# Patient Record
Sex: Female | Born: 2000 | Race: Black or African American | Hispanic: No | Marital: Single | Smoking: Current every day smoker
Health system: Southern US, Community
[De-identification: ages and names within clinical notes are randomized; demographics above are authoritative.]

## PROBLEM LIST (undated history)

## (undated) ENCOUNTER — Inpatient Hospital Stay (HOSPITAL_COMMUNITY): Payer: Self-pay

## (undated) ENCOUNTER — Ambulatory Visit (HOSPITAL_COMMUNITY): Payer: MEDICAID

## (undated) DIAGNOSIS — G40309 Generalized idiopathic epilepsy and epileptic syndromes, not intractable, without status epilepticus: Secondary | ICD-10-CM

## (undated) DIAGNOSIS — F32A Depression, unspecified: Secondary | ICD-10-CM

## (undated) HISTORY — DX: Depression, unspecified: F32.A

## (undated) HISTORY — DX: Generalized idiopathic epilepsy and epileptic syndromes, not intractable, without status epilepticus: G40.309

## (undated) HISTORY — PX: NO PAST SURGERIES: SHX2092

---
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
## 2022-01-04 ENCOUNTER — Encounter: Payer: Medicaid Other | Admitting: Family Medicine

## 2022-03-14 ENCOUNTER — Ambulatory Visit (HOSPITAL_COMMUNITY)
Admission: EM | Admit: 2022-03-14 | Discharge: 2022-03-14 | Disposition: A | Discharge status: Home / Self Care | Payer: Medicaid Other | Attending: Psychiatry | Admitting: Psychiatry

## 2022-03-14 DIAGNOSIS — F4323 Adjustment disorder with mixed anxiety and depressed mood: Secondary | ICD-10-CM | POA: Insufficient documentation

## 2022-03-14 DIAGNOSIS — B009 Herpesviral infection, unspecified: Secondary | ICD-10-CM | POA: Insufficient documentation

## 2022-03-14 NOTE — ED Provider Notes (Signed)
Behavioral Health Urgent Care Medical Screening Exam ? ?Patient Name: Samantha Rodriguez ?MRN: [id number] ?Date of Evaluation: 03/14/22 ?Chief Complaint:   ?Diagnosis:  ?Final diagnoses:  ?Adjustment disorder with mixed anxiety and depressed mood  ? ? ?History of Present illness: Samantha Rodriguez is a 21 y.o. female patient presented to [facility] as a walk in alone requesting outpatient psychiatric resources.  ? ?Samantha Rodriguez, 21 y.o., female patient seen face to face by this provider, consulted with Dr. Bronwen Betters; and chart reviewed on 03/14/22.  Patient reports no past psychiatric history. She does not take any medications at this time. Reports she did see a counselor at the [facility] after was diagnosed with the STD. She denies any IP admissions. She denies using any substances.  ? ?During evaluation Samantha Rodriguez is alert/oriented x 4 and cooperative. She is speaking in a clear tone at moderate volume, and normal pace; with good eye contact. She has normal speech and behavior. Her    thought process is coherent and relevant; There is no indication that she is currently responding to internal/external stimuli or experiencing  paranoid or delusional thought content. She denies AVH/SI/HI and self harm. She endorses increased anxiety and depression triggered by her birthday which is today. Reports one year ago on this day she obtained a sexually transmitted disease (herpes). States this has been traumatic for her and she is having trouble moving past it. She endorses feelings of low self esteem, feeling worthless, low motivation and feeling helpless. She is rejected by men and this also increases her depression.  She has had an increase in appetite and has gained 30lbs. She denies any concerns with sleep. Patient is logical and answered questions appropriately.   ? ?Discussed PHP program with patient and she is willing to participate, referral was made.  ? ?At this time Samantha Rodriguez is educated and verbalizes  understanding of mental health resources and other crisis services in the community. She is instructed to call 911 and present to the nearest emergency room should she experience any suicidal/homicidal ideation, auditory/visual/hallucinations, or detrimental worsening of her mental health condition. She was a also advised by Clinical research associate that she could call the toll-free phone on insurance card to assist with identifying in network counselors and agencies or number on back of Medicaid card to speak with care coordinator ?  ? ?Psychiatric Specialty Exam ? ?Presentation  ?General Appearance:Appropriate for Environment ? ?Eye Contact:Good ? ?Speech:Clear and Coherent; Normal Rate ? ?Speech Volume:No data recorded ?Handedness:Right ? ? ?Mood and Affect  ?Mood:Anxious; Depressed ? ?Affect:Appropriate ? ? ?Thought Process  ?Thought Processes:Coherent ? ?Descriptions of Associations:Intact ? ?Orientation:Full (Time, Place and Person) ? ?Thought Content:Logical ?   Hallucinations:None ? ?Ideas of Reference:None ? ?Suicidal Thoughts:No ? ?Homicidal Thoughts:No ? ? ?Sensorium  ?Memory:Immediate Good; Recent Good; Remote Good ? ?Judgment:Good ? ?Insight:Good ? ? ?Executive Functions  ?Concentration:Good ? ?Attention Span:Good ? ?Recall:Good ? ?Fund of Knowledge:Good ? ?Language:Good ? ? ?Psychomotor Activity  ?Psychomotor Activity:Normal ? ? ?Assets  ?Assets:Communication Skills; Desire for Improvement; Financial Resources/Insurance; Housing; Leisure Time; Physical Health; Resilience ? ? ?Sleep  ?Sleep:Good ? ?Number of hours: No data recorded ? ?No data recorded ? ?Physical Exam: ?Physical Exam ?Vitals and nursing note reviewed.  ?Constitutional:   ?   General: She is not in acute distress. ?   Appearance: Normal appearance. She is not ill-appearing.  ?HENT:  ?   Head: Normocephalic.  ?Eyes:  ?   General:     ?  Right eye: No discharge.     ?   Left eye: No discharge.  ?   Conjunctiva/sclera: Conjunctivae normal.  ?Cardiovascular:   ?   Rate and Rhythm: Normal rate.  ?Pulmonary:  ?   Effort: Pulmonary effort is normal. No respiratory distress.  ?Musculoskeletal:     ?   General: Normal range of motion.  ?   Cervical back: Normal range of motion.  ?Skin: ?   Coloration: Skin is not jaundiced or pale.  ?Neurological:  ?   Mental Status: She is alert and oriented to person, place, and time.  ?Psychiatric:     ?   Attention and Perception: Attention and perception normal.     ?   Mood and Affect: Mood is anxious and depressed.     ?   Speech: Speech normal.     ?   Behavior: Behavior normal. Behavior is cooperative.     ?   Thought Content: Thought content normal.     ?   Cognition and Memory: Cognition normal.     ?   Judgment: Judgment normal.  ? ?Review of Systems  ?Constitutional: Negative.   ?HENT: Negative.    ?Eyes: Negative.   ?Respiratory: Negative.    ?Cardiovascular: Negative.   ?Musculoskeletal: Negative.   ?Skin: Negative.   ?Neurological: Negative.   ?Psychiatric/Behavioral:  Positive for depression. The patient is nervous/anxious.   ?There were no vitals taken for this visit. There is no height or weight on file to calculate BMI. ? ?Musculoskeletal: ?Strength & Muscle Tone: within normal limits ?Gait & Station: normal ?Patient leans: N/A ? ? ?[facility] MSE Discharge Disposition for Follow up and Recommendations: ?Based on my evaluation the patient does not appear to have an emergency medical condition and can be discharged with resources and follow up care in outpatient services for Medication Management, Partial Hospitalization Program, and Individual Therapy ? ?Discharge patient.  ? ?Referral made to [facility] PHP program.  ? ?Provided out patient psychiatric resources.  ? ?No evidence of imminent risk to self or others at present.    ?Patient does not meet criteria for psychiatric inpatient admission. ?Discussed crisis plan, support from social network, calling 911, coming to the Emergency Department, and  calling Suicide Hotline.  ? ? ?Ardis Hughs, NP ?03/14/2022, 3:45 PM ? ?

## 2022-03-14 NOTE — Progress Notes (Signed)
?   03/14/22 1352  ?[facility] Triage Screening (Walk-ins at [facility] only)  ?How Did You Hear About [place]? Self  ?What Is the Reason for Your Visit/Call Today? 21 year old Samantha Rodriguez present to the [facility] with depressive symptoms triggered by a sexual disease she got last year on her birthday (herpes).  Today being the anniversary of the traumatic experience patient report being depressed. Report since learning her status she's been unable sleep, self-conscious, low self-esteem, weight gain-30 pounds, and being rejected by men. Pt denied suicidal/homicidal thoughts and auditory/visual hallucinations.  ?How Long Has This Been Causing You Problems? > than 6 months  ?Have You Recently Had Any Thoughts About Hurting Yourself? No  ?Are You Planning to Commit Suicide/Harm Yourself At This time? No  ?Have you Recently Had Thoughts About Hurting Someone Karolee Ohs? No  ?Are You Planning To Harm Someone At This Time? No  ?Are you currently experiencing any auditory, visual or other hallucinations? No  ?Have You Used Any Alcohol or Drugs in the Past 24 Hours? No  ?Do you have any current medical co-morbidities that require immediate attention? No  ?Clinician description of patient physical appearance/behavior: Depressed appropriate for the weather, cooperative and pleasant.  ?What Do You Feel Would Help You the Most Today? Stress Management  ?If access to [facility] was not available, would you have sought care in the Emergency Department? No  ?Determination of Need Routine (7 days)  ?Options For Referral Outpatient Therapy;Medication Management  ? ? ?

## 2022-03-14 NOTE — Discharge Instructions (Addendum)
The suicide prevention education provided includes the following: Suicide risk factors Suicide prevention and interventions National Suicide Hotline telephone number [facility] assessment telephone number [place] Emergency Assistance 911 County and/or Residential Mobile Crisis Unit telephone number   Request made of family/significant other to: Remove weapons (e.g., guns, rifles, knives), all items previously/currently identified as safety concern.   Remove drugs/medications (over the counter, prescriptions, illicit drugs), all items previously/currently identified as a safety concern.    Please contact one of the following facilities to start medication management and therapy services:   [facility] [address] (SECOND FLOOR)  [place], [place]  [address] Phone: [phone]  [facility]  [address] , [place] [address] Phone: [phone]  [facility] [place]  [address]  [place], [place] [address]  [facility] [place]  [address]  [place], [place] [address] Phone: [phone]   

## 2022-03-15 ENCOUNTER — Telehealth (HOSPITAL_COMMUNITY): Payer: Self-pay | Admitting: Professional

## 2022-03-17 ENCOUNTER — Telehealth (HOSPITAL_COMMUNITY): Payer: Self-pay | Admitting: Professional

## 2022-03-17 ENCOUNTER — Ambulatory Visit (HOSPITAL_COMMUNITY): Payer: Medicaid Other | Admitting: Professional

## 2022-03-17 DIAGNOSIS — F332 Major depressive disorder, recurrent severe without psychotic features: Secondary | ICD-10-CM | POA: Insufficient documentation
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
## 2022-03-21 ENCOUNTER — Ambulatory Visit (HOSPITAL_COMMUNITY): Payer: Medicaid Other

## 2022-03-22 ENCOUNTER — Encounter (HOSPITAL_COMMUNITY): Payer: Self-pay

## 2022-03-22 ENCOUNTER — Ambulatory Visit (HOSPITAL_COMMUNITY): Payer: Medicaid Other | Admitting: Licensed Clinical Social Worker

## 2022-03-22 DIAGNOSIS — F332 Major depressive disorder, recurrent severe without psychotic features: Secondary | ICD-10-CM

## 2022-03-22 MED ORDER — CITALOPRAM HYDROBROMIDE 10 MG PO TABS: 10.0000 mg | ORAL_TABLET | Freq: Every day | ORAL | 2 refills | Status: DC

## 2022-03-22 MED ORDER — HYDROXYZINE HCL 10 MG PO TABS: 10.0000 mg | ORAL_TABLET | Freq: Three times a day (TID) | ORAL | 0 refills | Status: DC | PRN

## 2022-03-22 NOTE — Progress Notes (Deleted)
Virtual Visit via Video Note  I connected with Samantha Rodriguez on 03/22/22 at  9:00 AM EDT by a video enabled telemedicine application and verified that I am speaking with the correct person using two identifiers.  Location: Patient: *** Provider: ***   I discussed the limitations of evaluation and management by telemedicine and the availability of in person appointments. The patient expressed understanding and agreed to proceed.  History of Present Illness:    Observations/Objective:   Assessment and Plan:   Follow Up Instructions:    I discussed the assessment and treatment plan with the patient. The patient was provided an opportunity to ask questions and all were answered. The patient agreed with the plan and demonstrated an understanding of the instructions.   The patient was advised to call back or seek an in-person evaluation if the symptoms worsen or if the condition fails to improve as anticipated.  I provided *** minutes of non-face-to-face time during this encounter.   [name], NP    Psychiatric Initial Adult Assessment   Patient Identification: Samantha Rodriguez MRN:  [id number] Date of Evaluation:  03/22/2022 Referral Source: *** Chief Complaint:  No chief complaint on file.  Visit Diagnosis: No diagnosis found.  History of Present Illness:  ***  Associated Signs/Symptoms: Depression Symptoms:  {DEPRESSION SYMPTOMS:20000} (Hypo) Manic Symptoms:  {BHH MANIC SYMPTOMS:22872} Anxiety Symptoms:  {BHH ANXIETY SYMPTOMS:22873} Psychotic Symptoms:  {BHH PSYCHOTIC SYMPTOMS:22874} PTSD Symptoms: {BHH PTSD SYMPTOMS:22875}  Past Psychiatric History: ***  Previous Psychotropic Medications: {YES/NO:21197}  Substance Abuse History in the last 12 months:  {yes no:314532}  Consequences of Substance Abuse: {BHH CONSEQUENCES OF SUBSTANCE ABUSE:22880}  Past Medical History:  Past Medical History:  Diagnosis Date   Depression    Generalized nonconvulsive  epilepsy without mention of intractable epilepsy     Past Surgical History:  Procedure Laterality Date   NO PAST SURGERIES      Family Psychiatric History: ***  Family History:  Family History  Problem Relation Age of Onset   Anxiety disorder Mother    Depression Mother    Healthy Mother    Alcohol abuse Father    Alcohol abuse Sister    Schizophrenia Maternal Aunt    Anxiety disorder Maternal Aunt    Depression Maternal Aunt    Drug abuse Maternal Uncle    Alcohol abuse Maternal Grandfather     Social History:   Social History   Socioeconomic History   Marital status: Single    Spouse name: Not on file   Number of children: 1   Years of education: Not on file   Highest education level: High school graduate  Occupational History   Not on file  Tobacco Use   Smoking status: Former    Packs/day: 0.10    Types: Cigars, Cigarettes    Quit date: 02/03/2019    Years since quitting: 3.1   Smokeless tobacco: Never  Vaping Use   Vaping Use: Never used  Substance and Sexual Activity   Alcohol use: Not Currently   Drug use: Not Currently    Types: Marijuana   Sexual activity: Yes    Birth control/protection: None  Other Topics Concern   Not on file  Social History Narrative   Not on file   Social Determinants of Health   Financial Resource Strain: Not on file  Food Insecurity: Not on file  Transportation Needs: Not on file  Physical Activity: Not on file  Stress: Not on file  Social Connections:  Not on file    Additional Social History: ***  Allergies:  No Known Allergies  Metabolic Disorder Labs: No results found for: HGBA1C, MPG No results found for: PROLACTIN No results found for: CHOL, TRIG, HDL, CHOLHDL, VLDL, LDLCALC Lab Results  Component Value Date   TSH 0.743 04/27/2020    Therapeutic Level Labs: No results found for: LITHIUM No results found for: CBMZ No results found for: VALPROATE  Current Medications: Current Outpatient Medications   Medication Sig Dispense Refill   citalopram (CELEXA) 10 MG tablet Take 1 tablet (10 mg total) by mouth daily. 30 tablet 2   hydrOXYzine (ATARAX) 10 MG tablet Take 1 tablet (10 mg total) by mouth 3 (three) times daily as needed. 30 tablet 0   doxycycline (VIBRA-TABS) 100 MG tablet Take 1 tablet (100 mg total) by mouth 2 (two) times daily. (Patient not taking: Reported on 04/27/2021) 14 tablet 0   metroNIDAZOLE (FLAGYL) 500 MG tablet Take 1 tablet (500 mg total) by mouth 2 (two) times daily. (Patient not taking: Reported on 04/27/2021) 14 tablet 0   Norethindrone Acetate-Ethinyl Estrad-FE (LOESTRIN 24 FE) 1-20 MG-MCG(24) tablet Take 1 tablet by mouth daily. (Patient not taking: Reported on 04/27/2021) 28 tablet 11   No current facility-administered medications for this visit.    Musculoskeletal: Strength & Muscle Tone: {desc; muscle tone:32375} Gait & Station: {PE GAIT ED ZOXW:96045}ATL:22525} Patient leans: {Patient Leans:21022755}  Psychiatric Specialty Exam: Review of Systems  There were no vitals taken for this visit.There is no height or weight on file to calculate BMI.  General Appearance: {Appearance:22683}  Eye Contact:  {BHH EYE CONTACT:22684}  Speech:  {Speech:22685}  Volume:  {Volume (PAA):22686}  Mood:  {BHH MOOD:22306}  Affect:  {Affect (PAA):22687}  Thought Process:  {Thought Process (PAA):22688}  Orientation:  {BHH ORIENTATION (PAA):22689}  Thought Content:  {Thought Content:22690}  Suicidal Thoughts:  {ST/HT (PAA):22692}  Homicidal Thoughts:  {ST/HT (PAA):22692}  Memory:  {BHH MEMORY:22881}  Judgement:  {Judgement (PAA):22694}  Insight:  {Insight (PAA):22695}  Psychomotor Activity:  {Psychomotor (PAA):22696}  Concentration:  {Concentration:21399}  Recall:  {BHH GOOD/FAIR/POOR:22877}  Fund of Knowledge:{BHH GOOD/FAIR/POOR:22877}  Language: {BHH GOOD/FAIR/POOR:22877}  Akathisia:  {BHH YES OR NO:22294}  Handed:  {Handed:22697}  AIMS (if indicated):  {Desc; done/not:10129}   Assets:  {Assets (PAA):22698}  ADL's:  {BHH WUJ'W:11914}ADL'S:22290}  Cognition: {chl bhh cognition:304700322}  Sleep:  {BHH GOOD/FAIR/POOR:22877}   Screenings: Insurance account managerHQ2-9    Flowsheet Row [facility] Counsel[facility]  PHQ-2 Total Score 3 2 2  0  PHQ-9 Total Score 12 15 7  0      Flowsheet Row [facility]  [facility]  [facility]   C-SSRS RISK CATEGORY No Risk No Risk No Risk       Assessment and Plan: ***  Collaboration of Care: {BH OP Collaboration of Care:21014065}  Patient/Guardian was advised Release of Information must be obtained prior to any record release in order to collaborate their care with an outside provider. Patient/Guardian was advised if they have not already done so to contact the registration department to sign all necessary forms in order for us to release information regarding their care.   Consent: Patient/Guardian  gives verbal consent for treatment and assignment of benefits for services provided during this visit. Patient/Guardian expressed understanding and agreed to proceed.   [name], NP 5/23/202311:17 AM

## 2022-03-22 NOTE — Progress Notes (Unsigned)
Spoke with patient via Webex video call, used 2 identifiers to correctly identify patient. States this is her first time in [facility] as recommended by [facility] therapist. She went to be seen for depression. Has been having a hard time dealing with the diagnosis of genital herpes. She found out she contracted it last year on her birthday. She has a lot of anger and anxiety as well. On scale 1-10 as 10 being worst she rates depression at 5 and anxiety at 7. Denies SI/HI or AV hallucinations. PHQ9=12. No issues or complaints. Would like to discuss starting an antidepressant. Message sent to NP.

## 2022-03-23 ENCOUNTER — Ambulatory Visit (INDEPENDENT_AMBULATORY_CARE_PROVIDER_SITE_OTHER): Payer: Medicaid Other | Admitting: Licensed Clinical Social Worker

## 2022-03-23 DIAGNOSIS — F332 Major depressive disorder, recurrent severe without psychotic features: Secondary | ICD-10-CM | POA: Diagnosis not present

## 2022-03-24 ENCOUNTER — Ambulatory Visit (INDEPENDENT_AMBULATORY_CARE_PROVIDER_SITE_OTHER): Payer: Medicaid Other | Admitting: Licensed Clinical Social Worker

## 2022-03-24 DIAGNOSIS — F332 Major depressive disorder, recurrent severe without psychotic features: Secondary | ICD-10-CM

## 2022-03-25 ENCOUNTER — Ambulatory Visit (INDEPENDENT_AMBULATORY_CARE_PROVIDER_SITE_OTHER): Payer: Medicaid Other | Admitting: Licensed Clinical Social Worker

## 2022-03-25 DIAGNOSIS — F332 Major depressive disorder, recurrent severe without psychotic features: Secondary | ICD-10-CM | POA: Diagnosis not present

## 2022-03-29 ENCOUNTER — Ambulatory Visit (HOSPITAL_COMMUNITY): Payer: Medicaid Other

## 2022-03-29 ENCOUNTER — Telehealth (HOSPITAL_COMMUNITY): Payer: Self-pay | Admitting: Licensed Clinical Social Worker

## 2022-03-29 NOTE — Progress Notes (Signed)
Virtual Visit via Video Note  I connected with Samantha Rodriguez on 03/29/22 at  9:00 AM EDT by a video enabled telemedicine application and verified that I am speaking with the correct person using two identifiers.  Location: Patient: home Provider: Office   I discussed the limitations of evaluation and management by telemedicine and the availability of in person appointments. The patient expressed understanding and agreed to proceed.  I discussed the assessment and treatment plan with the patient. The patient was provided an opportunity to ask questions and all were answered. The patient agreed with the plan and demonstrated an understanding of the instructions.   The patient was advised to call back or seek an in-person evaluation if the symptoms worsen or if the condition fails to improve as anticipated.  I provided 15 minutes of non-face-to-face time during this encounter.   Samantha Rack, NP    Psychiatric Initial Adult Assessment   Patient Identification: Samantha Rodriguez MRN:  [id number] Date of Evaluation:  03/29/2022 Referral Source: [facility]  Chief Complaint: Reported feeling stressed and overwhelmed seeking therapy services  Visit Diagnosis:    ICD-10-CM   1. Severe episode of recurrent major depressive disorder, without psychotic features (HCC)  F33.2       History of Present Illness:: Samantha Rodriguez is a 21 year old female who presents with multiple stressors related to has sexually transmitted infection.  States she contracted herpes and every year around her birthday she is reminded.    Patient was initiated on Celexa however states she has not had time to pick up medications.  Rates her depression 3 out of 10 with 10 being the worst.  States she was advised on seeking therapy services.  Denies illicit drug use or substance abuse history.Samantha Rodriguez validates information provided and below assessment.  patient to start partial hospitalization program 03/26/2022  Per  admission admission assessment note:"Reports she did see a counselor at the [facility] after was diagnosed with the STD. She denies any IP admissions. She denies using any substances. During evaluation Samantha Rodriguez is alert/oriented x 4 and cooperative. She is speaking in a clear tone at moderate volume, and normal pace; with good eye contact. She has normal speech and behavior. Her    thought process is coherent and relevant; There is no indication that she is currently responding to internal/external stimuli or experiencing  paranoid or delusional thought content. She denies AVH/SI/HI and self harm. She endorses increased anxiety and depression triggered by her birthday which is today. Reports one year ago on this day she obtained a sexually transmitted disease (herpes). States this has been traumatic for her and she is having trouble moving past it. She endorses feelings of low self esteem, feeling worthless, low motivation and feeling helpless. She is rejected by men and this also increases her depression.  She has had an increase in appetite and has gained 30lbs. She denies any concerns with sleep. Patient is logical and answered questions appropriately. "   Associated Signs/Symptoms: Depression Symptoms:  depressed mood, anxiety, (Hypo) Manic Symptoms:  Distractibility, Anxiety Symptoms:  Excessive Worry, Psychotic Symptoms:  Hallucinations: None PTSD Symptoms: Had a traumatic exposure:  due to STI exposure  Past Psychiatric History:   Previous Psychotropic Medications: No   Substance Abuse History in the last 12 months:  No.  Consequences of Substance Abuse: Associated Signs/Symptoms: Depression Symptoms:  depressed mood, anxiety, (Hypo) Manic Symptoms:  Distractibility, Anxiety Symptoms:  Excessive Worry, Psychotic Symptoms:  Hallucinations: None PTSD Symptoms: NA  Past Psychiatric History:   Previous Psychotropic Medications: No   Substance Abuse History in the last 12 months:   No.  Consequences of Substance Abuse: NA  Past Medical History:  Past Medical History:  Diagnosis Date   Depression    Generalized nonconvulsive epilepsy without mention of intractable epilepsy     Past Surgical History:  Procedure Laterality Date   NO PAST SURGERIES      Family Psychiatric History:   Family History:  Family History  Problem Relation Age of Onset   Anxiety disorder Mother    Depression Mother    Healthy Mother    Alcohol abuse Father    Alcohol abuse Sister    Schizophrenia Maternal Aunt    Anxiety disorder Maternal Aunt    Depression Maternal Aunt    Drug abuse Maternal Uncle    Alcohol abuse Maternal Grandfather     Social History:   Social History   Socioeconomic History   Marital status: Single    Spouse name: Not on file   Number of children: 1   Years of education: Not on file   Highest education level: High school graduate  Occupational History   Not on file  Tobacco Use   Smoking status: Former    Packs/day: 0.10    Types: Cigars, Cigarettes    Quit date: 02/03/2019    Years since quitting: 3.1   Smokeless tobacco: Never  Vaping Use   Vaping Use: Never used  Substance and Sexual Activity   Alcohol use: Not Currently   Drug use: Not Currently    Types: Marijuana   Sexual activity: Yes    Birth control/protection: None  Other Topics Concern   Not on file  Social History Narrative   Not on file   Social Determinants of Health   Financial Resource Strain: Not on file  Food Insecurity: Not on file  Transportation Needs: Not on file  Physical Activity: Not on file  Stress: Not on file  Social Connections: Not on file    Additional Social History:   Allergies:  No Known Allergies  Metabolic Disorder Labs: No results found for: HGBA1C, MPG No results found for: PROLACTIN No results found for: CHOL, TRIG, HDL, CHOLHDL, VLDL, LDLCALC Lab Results  Component Value Date   TSH 0.743 04/27/2020    Therapeutic Level  Labs: No results found for: LITHIUM No results found for: CBMZ No results found for: VALPROATE  Current Medications: Current Outpatient Medications  Medication Sig Dispense Refill   citalopram (CELEXA) 10 MG tablet Take 1 tablet (10 mg total) by mouth daily. 30 tablet 2   doxycycline (VIBRA-TABS) 100 MG tablet Take 1 tablet (100 mg total) by mouth 2 (two) times daily. (Patient not taking: Reported on 04/27/2021) 14 tablet 0   hydrOXYzine (ATARAX) 10 MG tablet Take 1 tablet (10 mg total) by mouth 3 (three) times daily as needed. 30 tablet 0   metroNIDAZOLE (FLAGYL) 500 MG tablet Take 1 tablet (500 mg total) by mouth 2 (two) times daily. (Patient not taking: Reported on 04/27/2021) 14 tablet 0   Norethindrone Acetate-Ethinyl Estrad-FE (LOESTRIN 24 FE) 1-20 MG-MCG(24) tablet Take 1 tablet by mouth daily. (Patient not taking: Reported on 04/27/2021) 28 tablet 11   No current facility-administered medications for this visit.    Musculoskeletal: Strength & Muscle Tone: within normal limits Gait & Station: normal Patient leans: N/A  Psychiatric Specialty Exam: Review of Systems  Cardiovascular: Negative.   Gastrointestinal: Negative.   Psychiatric/Behavioral:  The  patient is nervous/anxious.   All other systems reviewed and are negative.  There were no vitals taken for this visit.There is no height or weight on file to calculate BMI.  General Appearance: Casual  Eye Contact:  Good  Speech:  Clear and Coherent  Volume:  Normal  Mood:  Anxious and Depressed  Affect:  Congruent  Thought Process:  Coherent  Orientation:  Full (Time, Place, and Person)  Thought Content:  Logical  Suicidal Thoughts:  No  Homicidal Thoughts:  No  Memory:  Immediate;   Good Recent;   Good  Judgement:  Good  Insight:  Good  Psychomotor Activity:  Normal  Concentration:  Concentration: Good  Recall:  Good  Fund of Knowledge:Good  Language: Good  Akathisia:  No  Handed:  Right  AIMS (if indicated):   done  Assets:  Communication Skills Desire for Improvement Resilience Social Support  ADL's:  Intact  Cognition: WNL  Sleep:  Good   Screenings: Insurance account [id number]    Flowsheet Row [facility]  PHQ-2 Total Score 3 2 2  0  PHQ-9 Total Score 12 15 7  0      Flowsheet Row [facility]  [facility]  ED [facility]   C-SSRS RISK CATEGORY No Risk No Risk No Risk       Assessment and Plan:  Start partial hospitalization programming Take medications as indicated  Collaboration of Care: Medication Management AEB patient encouraged to start Celexa  Patient/Guardian was advised Release of Information must be obtained prior to any record release in order to collaborate their care with an outside provider. Patient/Guardian was advised if they have not already done so to contact the registration department to sign all necessary forms in order for us to release information regarding their care.   Consent: Patient/Guardian gives verbal consent for treatment and assignment of benefits for services provided during this visit. Patient/Guardian expressed understanding and agreed to proceed.   Samantha Rackanika N Satya Buttram, NP 5/30/202311:42 AM

## 2022-03-30 ENCOUNTER — Ambulatory Visit (HOSPITAL_COMMUNITY): Payer: Medicaid Other | Admitting: Licensed Clinical Social Worker

## 2022-03-30 DIAGNOSIS — F332 Major depressive disorder, recurrent severe without psychotic features: Secondary | ICD-10-CM

## 2022-03-31 ENCOUNTER — Ambulatory Visit (INDEPENDENT_AMBULATORY_CARE_PROVIDER_SITE_OTHER): Payer: Medicaid Other | Admitting: Licensed Clinical Social Worker

## 2022-03-31 DIAGNOSIS — F332 Major depressive disorder, recurrent severe without psychotic features: Secondary | ICD-10-CM | POA: Diagnosis not present

## 2022-04-01 ENCOUNTER — Telehealth (HOSPITAL_COMMUNITY): Payer: Self-pay | Admitting: Professional

## 2022-04-01 ENCOUNTER — Ambulatory Visit (HOSPITAL_COMMUNITY): Payer: Medicaid Other

## 2022-04-04 ENCOUNTER — Telehealth (HOSPITAL_COMMUNITY): Payer: Self-pay | Admitting: Professional

## 2022-04-04 ENCOUNTER — Ambulatory Visit (HOSPITAL_COMMUNITY): Payer: Medicaid Other

## 2022-04-05 ENCOUNTER — Ambulatory Visit (HOSPITAL_COMMUNITY): Payer: Medicaid Other

## 2022-04-06 ENCOUNTER — Ambulatory Visit (HOSPITAL_COMMUNITY): Payer: Medicaid Other

## 2022-04-07 ENCOUNTER — Ambulatory Visit (HOSPITAL_COMMUNITY): Payer: Medicaid Other

## 2022-04-08 ENCOUNTER — Ambulatory Visit (HOSPITAL_COMMUNITY): Payer: Medicaid Other

## 2022-07-14 NOTE — Progress Notes (Deleted)
Patient ID: Samantha Rodriguez, female    DOB: 2000-12-20  MRN: [id number]  CC: STD Screening   Subjective: Samantha Rodriguez is a 21 y.o. female who presents for STD screening.   Her concerns today include: ***  Patient Active Problem List   Diagnosis Date Noted   Major depressive disorder, recurrent episode, severe (HCC) 03/17/2022   Trichomonal vaginitis 03/30/2021   Chlamydia 03/30/2021   Delayed postpartum hemorrhage 09/29/2019   Anemia 09/29/2019   Normal labor 09/28/2019   Type 3a perineal laceration 09/28/2019   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    Class: Chronic   Encounter for long-term (current) use of other medications 01/15/2013     Current Outpatient Medications on File Prior to Visit  Medication Sig Dispense Refill   citalopram (CELEXA) 10 MG tablet Take 1 tablet (10 mg total) by mouth daily. 30 tablet 2   doxycycline (VIBRA-TABS) 100 MG tablet Take 1 tablet (100 mg total) by mouth 2 (two) times daily. (Patient not taking: Reported on 04/27/2021) 14 tablet 0   hydrOXYzine (ATARAX) 10 MG tablet Take 1 tablet (10 mg total) by mouth 3 (three) times daily as needed. 30 tablet 0   metroNIDAZOLE (FLAGYL) 500 MG tablet Take 1 tablet (500 mg total) by mouth 2 (two) times daily. (Patient not taking: Reported on 04/27/2021) 14 tablet 0   Norethindrone Acetate-Ethinyl Estrad-FE (LOESTRIN 24 FE) 1-20 MG-MCG(24) tablet Take 1 tablet by mouth daily. (Patient not taking: Reported on 04/27/2021) 28 tablet 11   No current facility-administered medications on file prior to visit.    No Known Allergies  Social History   Socioeconomic History   Marital status: Single    Spouse name: Not on file   Number of children: 1   Years of education: Not on file   Highest education level: High school graduate  Occupational History   Not on file  Tobacco Use   Smoking status: Former    Packs/day: 0.10    Types: Cigars, Cigarettes     Quit date: 02/03/2019    Years since quitting: 3.4   Smokeless tobacco: Never  Vaping Use   Vaping Use: Never used  Substance and Sexual Activity   Alcohol use: Not Currently   Drug use: Not Currently    Types: Marijuana   Sexual activity: Yes    Birth control/protection: None  Other Topics Concern   Not on file  Social History Narrative   Not on file   Social Determinants of Health   Financial Resource Strain: Low Risk  (09/28/2019)   Overall Financial Resource Strain (CARDIA)    Difficulty of Paying Living Expenses: Not hard at all  Food Insecurity: No Food Insecurity (09/28/2019)   Hunger Vital Sign    Worried About Running Out of Food in the Last Year: Never true    Ran Out of Food in the Last Year: Never true  Transportation Needs: No Transportation Needs (09/28/2019)   PRAPARE - Administrator, Civil Service (Medical): No    Lack of Transportation (Non-Medical): No  Physical Activity: Not on file  Stress: Not on file  Social Connections: Not on file  Intimate Partner Violence: Not At Risk (09/28/2019)   Humiliation, Afraid, Rape, and Kick questionnaire    Fear of Current or Ex-Partner: No    Emotionally Abused: No    Physically Abused: No    Sexually Abused: No    Family History  Problem Relation Age of Onset  Anxiety disorder Mother    Depression Mother    Healthy Mother    Alcohol abuse Father    Alcohol abuse Sister    Schizophrenia Maternal Aunt    Anxiety disorder Maternal Aunt    Depression Maternal Aunt    Drug abuse Maternal Uncle    Alcohol abuse Maternal Grandfather     Past Surgical History:  Procedure Laterality Date   NO PAST SURGERIES      ROS: Review of Systems Negative except as stated above  PHYSICAL EXAM: There were no vitals taken for this visit.  Physical Exam  {female adult master:310786} {female adult master:310785}     Latest Ref Rng & Units 12/07/2018   11:23 AM  CMP  Glucose 70 - 99 mg/dL 91   BUN 4 - 18  mg/dL 9   Creatinine 1.94 - 1.74 mg/dL 0.81   Sodium 448 - 185 mmol/L 140   Potassium 3.5 - 5.1 mmol/L 3.6   Chloride 98 - 111 mmol/L 109   CO2 22 - 32 mmol/L 26   Calcium 8.9 - 10.3 mg/dL 8.7   Total Protein 6.5 - 8.1 g/dL 7.3   Total Bilirubin 0.3 - 1.2 mg/dL 0.3   Alkaline Phos 50 - 162 U/L 44   AST 15 - 41 U/L 16   ALT 0 - 44 U/L 12    Lipid Panel  No results found for: "CHOL", "TRIG", "HDL", "CHOLHDL", "VLDL", "LDLCALC", "LDLDIRECT"  CBC    Component Value Date/Time   WBC 10.4 02/04/2020 1446   WBC 8.6 09/30/2019 0727   RBC 4.53 02/04/2020 1446   RBC 2.65 (L) 09/30/2019 0727   HGB 12.2 02/04/2020 1446   HCT 36.8 02/04/2020 1446   PLT 374 02/04/2020 1446   MCV 81 02/04/2020 1446   MCH 26.9 02/04/2020 1446   MCH 26.8 09/30/2019 0727   MCHC 33.2 02/04/2020 1446   MCHC 32.1 09/30/2019 0727   RDW 13.1 02/04/2020 1446    ASSESSMENT AND PLAN:  There are no diagnoses linked to this encounter.   Patient was given the opportunity to ask questions.  Patient verbalized understanding of the plan and was able to repeat key elements of the plan. Patient was given clear instructions to go to Emergency Department or return to medical center if symptoms don't improve, worsen, or new problems develop.The patient verbalized understanding.   No orders of the defined types were placed in this encounter.    Requested Prescriptions    No prescriptions requested or ordered in this encounter    No follow-ups on file.  Rema Fendt, NP

## 2022-07-28 ENCOUNTER — Ambulatory Visit: Payer: Self-pay | Admitting: Family

## 2022-07-28 DIAGNOSIS — Z113 Encounter for screening for infections with a predominantly sexual mode of transmission: Secondary | ICD-10-CM

## 2022-07-29 NOTE — Psych (Signed)
Virtual Visit via Video Note  I connected with Samantha Rodriguez on 03/23/22 at  9:00 AM EDT by a video enabled telemedicine application and verified that I am speaking with the correct person using two identifiers.  Location: Patient: patient home Provider: clinical home office   I discussed the limitations of evaluation and management by telemedicine and the availability of in person appointments. The patient expressed understanding and agreed to proceed.  I discussed the assessment and treatment plan with the patient. The patient was provided an opportunity to ask questions and all were answered. The patient agreed with the plan and demonstrated an understanding of the instructions.   The patient was advised to call back or seek an in-person evaluation if the symptoms worsen or if the condition fails to improve as anticipated.  Pt was provided 240 minutes of non-face-to-face time during this encounter.   Donia Guiles, LCSW   [facility] PHP THERAPIST PROGRESS NOTE  Samantha Rodriguez [id number]  Session Time: 9:00 - 10:00  Participation Level: Active  Behavioral Response: CasualAlertDepressed  Type of Therapy: Group Therapy  Treatment Goals addressed: Coping  Progress Towards Goals: Initial  Interventions: CBT, DBT, Supportive, and Reframing  Summary: Clinician led check-in regarding current stressors and situation, and review of patient completed daily inventory. Clinician utilized active listening and empathetic response and validated patient emotions. Clinician facilitated processing group on pertinent issues.?    Summary: Samantha Rodriguez is a 21 y.o. female who presents with depression symptoms.  Patient arrived within time allowed. Patient rates her mood at a 9 on a scale of 1-10 with 10 being best. Pt states she feels "refreshed." Pt reports she slept "so good." Pt reports spending yesterday outside with her son. Pt reports struggles with focusing on herself. Patient able  to process. Patient engaged in discussion.        Session Time: 10:00 am - 11:00 am   Participation Level: Active   Behavioral Response: CasualAlertDepressed   Type of Therapy: Group Therapy   Treatment Goals addressed: Coping   Progress Towards Goals: Progressing   Interventions: CBT, DBT, Solution Focused, Strength-based, Supportive, and Reframing   Therapist Response: Cln led processing group for pt's current struggles. Group members shared stressors and provided support and feedback. Cln brought in topics of boundaries, healthy relationships, and unhealthy thought processes to inform discussion.   Therapist Response: Pt able to process and provide support to group.            Session Time: 11:00 -12:00   Participation Level: Active   Behavioral Response: CasualAlertDepressed   Type of Therapy: Group Therapy, Spiritual Care   Treatment Goals addressed: Coping   Progress Towards Goals: Progressing   Interventions: Supportive, Education   Summary:  Samantha Rodriguez, Chaplain, led group.   Therapist Response: Pt participated        Session Time: 12:00 -1:00   Participation Level: Active   Behavioral Response: CasualAlertDepressed   Type of Therapy: Group Therapy, OT   Treatment Goals addressed: Coping   Progress Towards Goals: Progressing   Interventions: Supportive, Education   Summary:  OT, Kerrin Champagne, led group. 12:50 -1:00 Clinician led check-out. Clinician assessed for immediate needs, medication compliance and efficacy, and safety concerns   Therapist Response: 12:00 - 12:50: Pt participated 12:50 - 1:00 pm: At check-out, patient reports no immediate concerns. Patient demonstrates progress as evidenced by improved sleep. Patient denies SI/HI/self-harm thoughts at the end of group.  Suicidal/Homicidal: Nowithout intent/plan   Plan: Pt  will continue in PHP while working to decrease depression symptoms, increase daily functioning, and increase  ability to manage symptoms in a healthy manner.   Collaboration of Care: Medication Management AEB T [place]  Patient/Guardian was advised Release of Information must be obtained prior to any record release in order to collaborate their care with an outside provider. Patient/Guardian was advised if they have not already done so to contact the registration department to sign all necessary forms in order for [place] to release information regarding their care.   Consent: Patient/Guardian gives verbal consent for treatment and assignment of benefits for services provided during this visit. Patient/Guardian expressed understanding and agreed to proceed.   Diagnosis: Severe episode of recurrent major depressive disorder, without psychotic features ([place]) [F33.2]    1. Severe episode of recurrent major depressive disorder, without psychotic features ([place])      Lorin Glass, LCSW

## 2022-07-29 NOTE — Psych (Signed)
Pt did not attend scheduled PHP session.

## 2022-07-29 NOTE — Psych (Unsigned)
Virtual Visit via Video Note  I connected with Samantha Rodriguez on 03/22/22 at  9:00 AM EDT by a video enabled telemedicine application and verified that I am speaking with the correct person using two identifiers.  Location: Patient: patient home Provider: clinical home office   I discussed the limitations of evaluation and management by telemedicine and the availability of in person appointments. The patient expressed understanding and agreed to proceed.  I discussed the assessment and treatment plan with the patient. The patient was provided an opportunity to ask questions and all were answered. The patient agreed with the plan and demonstrated an understanding of the instructions.   The patient was advised to call back or seek an in-person evaluation if the symptoms worsen or if the condition fails to improve as anticipated.  Pt was provided 240 minutes of non-face-to-face time during this encounter.   Donia Guiles, LCSW   [facility] PHP THERAPIST PROGRESS NOTE  Samantha Rodriguez [id number]  Session Time: 9:00 - 10:00  Participation Level: Active  Behavioral Response: CasualAlertDepressed  Type of Therapy: Group Therapy  Treatment Goals addressed: Coping  Progress Towards Goals: Initial  Interventions: CBT, DBT, Supportive, and Reframing  Summary: Clinician led check-in regarding current stressors and situation, and review of patient completed daily inventory. Clinician utilized active listening and empathetic response and validated patient emotions. Clinician facilitated processing group on pertinent issues.?    Summary: Samantha Rodriguez is a 21 y.o. female who presents with depression symptoms.  Patient arrived within time allowed. Patient rates her mood at a 8 on a scale of 1-10 with 10 being best. Pt states she feels "nervous." Pt reports feeling bolstered the past few days by the idea of group. Pt states yesterday was "fine." Pt reports impaired sleep due to anxiety re:  beginning group.  Patient able to process. Patient engaged in discussion.        Session Time: 10:00 am - 11:00 am   Participation Level: Active   Behavioral Response: CasualAlertDepressed   Type of Therapy: Group Therapy   Treatment Goals addressed: Coping   Progress Towards Goals: Progressing   Interventions: CBT, DBT, Solution Focused, Strength-based, Supportive, and Reframing   Therapist Response: Cln continued topic of boundaries. Cln discussed emotional and intellectual boundaries including they way they present and difficulties with both. Group members discussed the ways in which emotional and intellectual boundaries is a struggle for them.    Therapist Response: Pt engaged in discussion and is able to identify ways in which emotional and intellectual boundaries affect them.           Session Time: 11:00 -12:00   Participation Level: Active   Behavioral Response: CasualAlertDepressed   Type of Therapy: Group Therapy, Occupational Therapy   Treatment Goals addressed: Coping   Progress Towards Goals: Progressing   Interventions: Supportive, Education   Summary:  Occupational Therapy group led by cln E. Hollan.   Therapist Response: Pt participated         Session Time: 12:00 -1:00   Participation Level: Active   Behavioral Response: CasualAlertDepressed   Type of Therapy: Group therapy   Treatment Goals addressed: Coping   Progress Towards Goals: Progressing   Interventions: CBT; Solution focused; Supportive; Reframing   Summary: 12:00 - 12:50: Cln continued topic of boundaries. Cln discussed material and time boundaries including they way they present and difficulties with both. Group members discussed the ways in which material and time boundaries is a struggle for them.  12:50 -  1:00 Clinician led check-out. Clinician assessed for immediate needs, medication compliance and efficacy, and safety concerns.   Therapist Response: 12:00 - 12:50: Pt  engaged in discussion and is able to identify ways in which material and time boundaries affect them. 12:50 - 1:00 pm: At check-out, patient reports no immediate concerns. Patient demonstrates progress as evidenced by participation in first group session. Patient denies SI/HI/self-harm thoughts at the end of group.  Suicidal/Homicidal: Nowithout intent/plan   Plan: Pt will continue in PHP while working to decrease depression symptoms, increase daily functioning, and increase ability to manage symptoms in a healthy manner.   Collaboration of Care: Medication Management AEB T Lewis  Patient/Guardian was advised Release of Information must be obtained prior to any record release in order to collaborate their care with an outside provider. Patient/Guardian was advised if they have not already done so to contact the registration department to sign all necessary forms in order for [place] to release information regarding their care.   Consent: Patient/Guardian gives verbal consent for treatment and assignment of benefits for services provided during this visit. Patient/Guardian expressed understanding and agreed to proceed.   Diagnosis: Severe episode of recurrent major depressive disorder, without psychotic features ([place]) [F33.2]    1. Severe episode of recurrent major depressive disorder, without psychotic features ([place])      Lorin Glass, LCSW

## 2022-07-29 NOTE — Psych (Signed)
Virtual Visit via Video Note  I connected with Samantha Rodriguez on 03/25/22 at  9:00 AM EDT by a video enabled telemedicine application and verified that I am speaking with the correct person using two identifiers. Patient: patient home Provider: clinical home office   I discussed the limitations of evaluation and management by telemedicine and the availability of in person appointments. The patient expressed understanding and agreed to proceed.  I discussed the assessment and treatment plan with the patient. The patient was provided an opportunity to ask questions and all were answered. The patient agreed with the plan and demonstrated an understanding of the instructions.   The patient was advised to call back or seek an in-person evaluation if the symptoms worsen or if the condition fails to improve as anticipated.  Pt was provided 240 minutes of non-face-to-face time during this encounter.   Donia Guiles, LCSW   [facility] PHP THERAPIST PROGRESS NOTE  Samantha Rodriguez [id number]  Session Time: 9:00 - 10:00  Participation Level: Active  Behavioral Response: CasualAlertDepressed  Type of Therapy: Group Therapy  Treatment Goals addressed: Coping  Progress Towards Goals: Progressing  Interventions: CBT, DBT, Supportive, and Reframing  Summary: Clinician led check-in regarding current stressors and situation, and review of patient completed daily inventory. Clinician utilized active listening and empathetic response and validated patient emotions. Clinician facilitated processing group on pertinent issues.?    Summary: Samantha Rodriguez is a 21 y.o. female who presents with depression symptoms.  Patient arrived within time allowed. Patient rates her mood at a 9 on a scale of 1-10 with 10 being best. Pt states she feels "okay." Pt reports she did not sleep well last night getting less than 6 hours of broken sleep. Pt states spending time outside yesterday. Pt identifies feeling  "grumpy" and is unable to say why. Pt struggles with insight. Patient able to process. Patient engaged in discussion.        Session Time: 10:00 am - 11:00 am   Participation Level: Active   Behavioral Response: CasualAlertDepressed   Type of Therapy: Group Therapy   Treatment Goals addressed: Coping   Progress Towards Goals: Progressing   Interventions: CBT, DBT, Solution Focused, Strength-based, Supportive, and Reframing   Therapist Response: Cln led discussion on the ways our phones can cause barriers to our mental health. Group discussed struggles they have with their phones and the role it can play in unhealthy thought patterns and behaviors. Group worked to problem solve ways to cut down on negative behaviors.    Therapist Response: Pt engaged in discussion.        Session Time: 11:00 -12:00   Participation Level: Active   Behavioral Response: CasualAlertDepressed   Type of Therapy: Group Therapy   Treatment Goals addressed: Coping   Progress Towards Goals: Progressing   Interventions: CBT, DBT, Solution Focused, Strength-based, Supportive, and Reframing   Summary:  Cln led discussion on the connection between fear and anxiety. Cln contextualized anxiety and fear as both being future oriented and fed by the unknown. Group members shared ways in which fear interacts with their anxiety and the problems it creates. Cln encouraged CBT thought challenging and reframing as ways to address problematic fears and anxieties.    Therapist Response:  Pt engaged in discussion and is able to make connections between fear and anxiety.          Session Time: 12:00 -1:00   Participation Level: Active   Behavioral Response: CasualAlertDepressed   Type of  Therapy: Group therapy   Treatment Goals addressed: Coping   Progress Towards Goals: Progressing   Interventions: CBT; Solution focused; Supportive; Reframing   Summary: 12:00 - 12:50: Cln led discussion on decision  making and how to apply logic to fears. Group viewed TED talk "Why you should define your fears not your goals" to aid discussion. Group discussed ways to consider how we can address fears and make them manageable.  12:50 - 1:00 pm: At check-out, patient reports no immediate concerns. Patient demonstrates progress as evidenced by openness. Patient denies SI/HI/self-harm thoughts at the end of group.  Suicidal/Homicidal: Nowithout intent/plan   Plan: Pt will continue in PHP while working to decrease depression symptoms, increase daily functioning, and increase ability to manage symptoms in a healthy manner.   Collaboration of Care: Medication Management AEB T Lewis  Patient/Guardian was advised Release of Information must be obtained prior to any record release in order to collaborate their care with an outside provider. Patient/Guardian was advised if they have not already done so to contact the registration department to sign all necessary forms in order for [place] to release information regarding their care.   Consent: Patient/Guardian gives verbal consent for treatment and assignment of benefits for services provided during this visit. Patient/Guardian expressed understanding and agreed to proceed.   Diagnosis: Severe episode of recurrent major depressive disorder, without psychotic features ([place]) [F33.2]    1. Severe episode of recurrent major depressive disorder, without psychotic features ([place])      Lorin Glass, LCSW

## 2022-07-29 NOTE — Psych (Signed)
Virtual Visit via Video Note  I connected with Ephraim Hamburger on 03/24/22 at  9:00 AM EDT by a video enabled telemedicine application and verified that I am speaking with the correct person using two identifiers.  Location: Patient: patient home Provider: clinical home office   I discussed the limitations of evaluation and management by telemedicine and the availability of in person appointments. The patient expressed understanding and agreed to proceed.  I discussed the assessment and treatment plan with the patient. The patient was provided an opportunity to ask questions and all were answered. The patient agreed with the plan and demonstrated an understanding of the instructions.   The patient was advised to call back or seek an in-person evaluation if the symptoms worsen or if the condition fails to improve as anticipated.  Pt was provided 240 minutes of non-face-to-face time during this encounter.   Lorin Glass, LCSW   [facility] PHP THERAPIST PROGRESS NOTE  CISSY GALBREATH [id number]  Session Time: 9:00 - 10:00  Participation Level: Active  Behavioral Response: CasualAlertDepressed  Type of Therapy: Group Therapy  Treatment Goals addressed: Coping  Progress Towards Goals: Progressing  Interventions: CBT, DBT, Supportive, and Reframing  Summary: Clinician led check-in regarding current stressors and situation, and review of patient completed daily inventory. Clinician utilized active listening and empathetic response and validated patient emotions. Clinician facilitated processing group on pertinent issues.?    Summary: JAID QUIRION is a 21 y.o. female who presents with depression symptoms.  Patient arrived within time allowed. Patient rates her mood at a 10 on a scale of 1-10 with 10 being best. Pt states she feels "good." Pt reports she helped with her niece's birthday party yesterday and it kept her busy. Pt states struggling with social anxiety once the party  began and she tried to stay on the outskirts of things. Pt reports being triggered by large numbers of people and strangers. Pt states she slept 7 hours. Pt struggles with mood dependent thinking. Patient able to process. Patient engaged in discussion.        Session Time: 10:00 am - 11:00 am   Participation Level: Active   Behavioral Response: CasualAlertDepressed   Type of Therapy: Group Therapy   Treatment Goals addressed: Coping   Progress Towards Goals: Progressing   Interventions: CBT, DBT, Solution Focused, Strength-based, Supportive, and Reframing   Therapist Response: Cln led discussion on accountability and the balance between taking responsibility and not beating ourselves up. Group members shared current consequences they are dealing with and how they are processing them. Cln encouraged pt's to utilize the Best Friend Test to make it easier to offer themselves kindness.    Therapist Response: Pt engaged in discussion and is able to process.            Session Time: 11:00 -12:00   Participation Level: Active   Behavioral Response: CasualAlertDepressed   Type of Therapy: Group Therapy, Occupational Therapy   Treatment Goals addressed: Coping   Progress Towards Goals: Progressing   Interventions: Supportive, Education   Summary:  Occupational Therapy group led by cln E. Hollan.   Therapist Response: Pt participated         Session Time: 12:00 -1:00   Participation Level: Active   Behavioral Response: CasualAlertDepressed   Type of Therapy: Group therapy   Treatment Goals addressed: Coping   Progress Towards Goals: Progressing   Interventions: CBT; Solution focused; Supportive; Reframing   Summary: 12:00 - 12:50: Cln continued topic of boundaries  and led a "boundary workshop" in which group members brought current boundary issues and group worked together to apply boundary concepts to help address the concern. Cln helped shape conversation to  maintain fidelity.  12:50 -1:00 Clinician led check-out. Clinician assessed for immediate needs, medication compliance and efficacy, and safety concerns.   Therapist Response: 12:00 - 12:50: Pt engaged in discussion and shared a current boundary issue and reports gaining insight.  12:50 - 1:00 pm: At check-out, patient reports no immediate concerns. Patient demonstrates progress as evidenced by increased activity level. Patient denies SI/HI/self-harm thoughts at the end of group.  Suicidal/Homicidal: Nowithout intent/plan   Plan: Pt will continue in PHP while working to decrease depression symptoms, increase daily functioning, and increase ability to manage symptoms in a healthy manner.   Collaboration of Care: Medication Management AEB T Lewis  Patient/Guardian was advised Release of Information must be obtained prior to any record release in order to collaborate their care with an outside provider. Patient/Guardian was advised if they have not already done so to contact the registration department to sign all necessary forms in order for [place] to release information regarding their care.   Consent: Patient/Guardian gives verbal consent for treatment and assignment of benefits for services provided during this visit. Patient/Guardian expressed understanding and agreed to proceed.   Diagnosis: Severe episode of recurrent major depressive disorder, without psychotic features ([place]) [F33.2]    1. Severe episode of recurrent major depressive disorder, without psychotic features ([place])      Lorin Glass, LCSW

## 2022-07-29 NOTE — Psych (Signed)
Virtual Visit via Video Note  I connected with Samantha Rodriguez on 03/31/22 at  9:00 AM EDT by a video enabled telemedicine application and verified that I am speaking with the correct person using two identifiers. Patient: patient home Provider: clinical home office   I discussed the limitations of evaluation and management by telemedicine and the availability of in person appointments. The patient expressed understanding and agreed to proceed.  I discussed the assessment and treatment plan with the patient. The patient was provided an opportunity to ask questions and all were answered. The patient agreed with the plan and demonstrated an understanding of the instructions.   The patient was advised to call back or seek an in-person evaluation if the symptoms worsen or if the condition fails to improve as anticipated.  Pt was provided 240 minutes of non-face-to-face time during this encounter.   Samantha Guiles, LCSW   [facility] PHP THERAPIST PROGRESS NOTE  Samantha Rodriguez [id number]  Session Time: 9:00 - 10:00  Participation Level: Active  Behavioral Response: CasualAlertDepressed  Type of Therapy: Group Therapy  Treatment Goals addressed: Coping  Progress Towards Goals: Progressing  Interventions: CBT, DBT, Supportive, and Reframing  Summary: Clinician led check-in regarding current stressors and situation, and review of patient completed daily inventory. Clinician utilized active listening and empathetic response and validated patient emotions. Clinician facilitated processing group on pertinent issues.?    Summary: Samantha Rodriguez is a 21 y.o. female who presents with depression symptoms.  Patient arrived within time allowed. Patient rates her mood at a 9 on a scale of 1-10 with 10 being best. Pt states she feels "good." Pt reports waking up in a good mood. Pt states yesterday was "fine" and she spent it with her son outside. Pt reports 6 hours of sleep and impaired appetite.  Patient able to process. Patient engaged in discussion.        Session Time: 10:00 am - 11:00 am   Participation Level: Active   Behavioral Response: CasualAlertDepressed   Type of Therapy: Group Therapy   Treatment Goals addressed: Coping   Progress Towards Goals: Progressing   Interventions: CBT, DBT, Solution Focused, Strength-based, Supportive, and Reframing   Therapist Response: Cln led discussion on the 5 stages of grief and the way loss affects [place]. Cln encouraged pt to consider grief as a journey to accepting a new future. Cln created space for pt to process grief concerns and validated pt's experiences.    Therapist Response:  Pt engaged in discussion and was able to identify areas of loss and process.            Session Time: 11:00 -12:00   Participation Level: Active   Behavioral Response: CasualAlertDepressed   Type of Therapy: Group Therapy, Occupational Therapy   Treatment Goals addressed: Coping   Progress Towards Goals: Progressing   Interventions: Supportive, Education   Summary:  Occupational Therapy group led by cln Samantha Rodriguez.   Therapist Response: Pt participated         Session Time: 12:00 -1:00   Participation Level: Active   Behavioral Response: CasualAlertDepressed   Type of Therapy: Group therapy   Treatment Goals addressed: Coping   Progress Towards Goals: Progressing   Interventions: CBT; Solution focused; Supportive; Reframing   Summary: 12:00 - 12:50: Cln led discussion on "why" and "what now" in terms of how we focus on our problems. Group members shared how focus on "why" has impacted them and barriers to focusing on "what now." Group  able to process. Cln encouraged pt's to consider what "why" accomplishes.  12:50 -1:00 Clinician led check-out. Clinician assessed for immediate needs, medication compliance and efficacy, and safety concerns.   Therapist Response: 12:00 - 12:50: Pt engaged in discussion. 12:50 - 1:00 pm: At  check-out, patient reports no immediate concerns. Patient demonstrates progress as evidenced by improved mood. Patient denies SI/HI/self-harm thoughts at the end of group.  Suicidal/Homicidal: Nowithout intent/plan   Plan: Pt will continue in PHP while working to decrease depression symptoms, increase daily functioning, and increase ability to manage symptoms in a healthy manner.   Collaboration of Care: Medication Management AEB Samantha Rodriguez  Patient/Guardian was advised Release of Information must be obtained prior to any record release in order to collaborate their care with an outside provider. Patient/Guardian was advised if they have not already done so to contact the registration department to sign all necessary forms in order for [place] to release information regarding their care.   Consent: Patient/Guardian gives verbal consent for treatment and assignment of benefits for services provided during this visit. Patient/Guardian expressed understanding and agreed to proceed.   Diagnosis: Severe episode of recurrent major depressive disorder, without psychotic features (Samantha Rodriguez) [F33.2]    1. Severe episode of recurrent major depressive disorder, without psychotic features (Samantha Rodriguez)      Samantha Glass, LCSW

## 2022-08-01 NOTE — Progress Notes (Deleted)
Patient ID: Samantha Rodriguez, female    DOB: 07/25/2001  MRN: [id number]  CC: STD Screening   Subjective: Samantha Rodriguez is a 21 y.o. female who presents for STD screening.   Her concerns today include:    Which STD tests?  LMP?   Patient Active Problem List   Diagnosis Date Noted   Major depressive disorder, recurrent episode, severe ([place]) 03/17/2022   Trichomonal vaginitis 03/30/2021   Chlamydia 03/30/2021   Delayed postpartum hemorrhage 09/29/2019   Anemia 09/29/2019   Normal labor 09/28/2019   Type 3a perineal laceration 09/28/2019   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    Class: Chronic   Encounter for long-term (current) use of other medications 01/15/2013     Current Outpatient Medications on File Prior to Visit  Medication Sig Dispense Refill   citalopram (CELEXA) 10 MG tablet Take 1 tablet (10 mg total) by mouth daily. 30 tablet 2   doxycycline (VIBRA-TABS) 100 MG tablet Take 1 tablet (100 mg total) by mouth 2 (two) times daily. (Patient not taking: Reported on 04/27/2021) 14 tablet 0   hydrOXYzine (ATARAX) 10 MG tablet Take 1 tablet (10 mg total) by mouth 3 (three) times daily as needed. 30 tablet 0   metroNIDAZOLE (FLAGYL) 500 MG tablet Take 1 tablet (500 mg total) by mouth 2 (two) times daily. (Patient not taking: Reported on 04/27/2021) 14 tablet 0   Norethindrone Acetate-Ethinyl Estrad-FE (LOESTRIN 24 FE) 1-20 MG-MCG(24) tablet Take 1 tablet by mouth daily. (Patient not taking: Reported on 04/27/2021) 28 tablet 11   No current facility-administered medications on file prior to visit.    No Known Allergies  Social History   Socioeconomic History   Marital status: Single    Spouse name: Not on file   Number of children: 1   Years of education: Not on file   Highest education level: High school graduate  Occupational History   Not on file  Tobacco Use   Smoking status: Former    Packs/day: 0.10     Types: Cigars, Cigarettes    Quit date: 02/03/2019    Years since quitting: 3.4   Smokeless tobacco: Never  Vaping Use   Vaping Use: Never used  Substance and Sexual Activity   Alcohol use: Not Currently   Drug use: Not Currently    Types: Marijuana   Sexual activity: Yes    Birth control/protection: None  Other Topics Concern   Not on file  Social History Narrative   Not on file   Social Determinants of Health   Financial Resource Strain: Low Risk  (09/28/2019)   Overall Financial Resource Strain (CARDIA)    Difficulty of Paying Living Expenses: Not hard at all  Food Insecurity: No Food Insecurity (09/28/2019)   Hunger Vital Sign    Worried About Running Out of Food in the Last Year: Never true    [place] in the Last Year: Never true  Transportation Needs: No Transportation Needs (09/28/2019)   PRAPARE - Hydrologist (Medical): No    Lack of Transportation (Non-Medical): No  Physical Activity: Not on file  Stress: Not on file  Social Connections: Not on file  Intimate Partner Violence: Not At Risk (09/28/2019)   Humiliation, Afraid, Rape, and Kick questionnaire    Fear of Current or Ex-Partner: No    Emotionally Abused: No    Physically Abused: No    Sexually Abused: No  Family History  Problem Relation Age of Onset   Anxiety disorder Mother    Depression Mother    Healthy Mother    Alcohol abuse Father    Alcohol abuse Sister    Schizophrenia Maternal Aunt    Anxiety disorder Maternal Aunt    Depression Maternal Aunt    Drug abuse Maternal Uncle    Alcohol abuse Maternal Grandfather     Past Surgical History:  Procedure Laterality Date   NO PAST SURGERIES      ROS: Review of Systems Negative except as stated above  PHYSICAL EXAM: There were no vitals taken for this visit.  Physical Exam  {female adult master:310786} {female adult master:310785}     Latest Ref Rng & Units 12/07/2018   11:23 AM  CMP  Glucose  70 - 99 mg/dL 91   BUN 4 - 18 mg/dL 9   Creatinine 5.40 - 9.81 mg/dL 1.91   Sodium 478 - 295 mmol/L 140   Potassium 3.5 - 5.1 mmol/L 3.6   Chloride 98 - 111 mmol/L 109   CO2 22 - 32 mmol/L 26   Calcium 8.9 - 10.3 mg/dL 8.7   Total Protein 6.5 - 8.1 g/dL 7.3   Total Bilirubin 0.3 - 1.2 mg/dL 0.3   Alkaline Phos 50 - 162 U/L 44   AST 15 - 41 U/L 16   ALT 0 - 44 U/L 12    Lipid Panel  No results found for: "CHOL", "TRIG", "HDL", "CHOLHDL", "VLDL", "LDLCALC", "LDLDIRECT"  CBC    Component Value Date/Time   WBC 10.4 02/04/2020 1446   WBC 8.6 09/30/2019 0727   RBC 4.53 02/04/2020 1446   RBC 2.65 (L) 09/30/2019 0727   HGB 12.2 02/04/2020 1446   HCT 36.8 02/04/2020 1446   PLT 374 02/04/2020 1446   MCV 81 02/04/2020 1446   MCH 26.9 02/04/2020 1446   MCH 26.8 09/30/2019 0727   MCHC 33.2 02/04/2020 1446   MCHC 32.1 09/30/2019 0727   RDW 13.1 02/04/2020 1446    ASSESSMENT AND PLAN:  There are no diagnoses linked to this encounter.   Patient was given the opportunity to ask questions.  Patient verbalized understanding of the plan and was able to repeat key elements of the plan. Patient was given clear instructions to go to Emergency Department or return to medical center if symptoms don't improve, worsen, or new problems develop.The patient verbalized understanding.   No orders of the defined types were placed in this encounter.    Requested Prescriptions    No prescriptions requested or ordered in this encounter    No follow-ups on file.  Rema Fendt, NP
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
## 2022-08-08 ENCOUNTER — Ambulatory Visit: Payer: Medicaid Other | Admitting: Family

## 2022-08-08 DIAGNOSIS — Z113 Encounter for screening for infections with a predominantly sexual mode of transmission: Secondary | ICD-10-CM

## 2022-08-15 ENCOUNTER — Ambulatory Visit (INDEPENDENT_AMBULATORY_CARE_PROVIDER_SITE_OTHER): Payer: Self-pay | Admitting: Family

## 2022-08-15 ENCOUNTER — Ambulatory Visit (INDEPENDENT_AMBULATORY_CARE_PROVIDER_SITE_OTHER): Payer: Medicaid Other

## 2022-08-15 ENCOUNTER — Other Ambulatory Visit (HOSPITAL_COMMUNITY)
Admission: RE | Admit: 2022-08-15 | Discharge: 2022-08-15 | Disposition: A | Discharge status: Home / Self Care | Payer: Medicaid Other | Source: Ambulatory Visit | Attending: Family | Admitting: Family

## 2022-08-15 VITALS — BP 120/73 | HR 68 | Temp 98.3°F | Resp 16 | Ht 59.0 in | Wt 155.0 lb

## 2022-08-15 DIAGNOSIS — Z8269 Family history of other diseases of the musculoskeletal system and connective tissue: Secondary | ICD-10-CM

## 2022-08-15 DIAGNOSIS — Z113 Encounter for screening for infections with a predominantly sexual mode of transmission: Secondary | ICD-10-CM | POA: Insufficient documentation

## 2022-08-15 DIAGNOSIS — M549 Dorsalgia, unspecified: Secondary | ICD-10-CM | POA: Diagnosis not present

## 2022-08-15 DIAGNOSIS — M545 Low back pain, unspecified: Secondary | ICD-10-CM

## 2022-08-15 LAB — POCT URINALYSIS DIP (CLINITEK)
Bilirubin, UA: NEGATIVE
Glucose, UA: NEGATIVE mg/dL
Ketones, POC UA: NEGATIVE mg/dL
Nitrite, UA: NEGATIVE
POC PROTEIN,UA: NEGATIVE
Spec Grav, UA: 1.025 (ref 1.010–1.025)
Urobilinogen, UA: 0.2 E.U./dL
pH, UA: 6 (ref 5.0–8.0)

## 2022-08-15 LAB — POCT URINE PREGNANCY: Preg Test, Ur: NEGATIVE

## 2022-08-15 MED ORDER — TRIAMCINOLONE ACETONIDE 40 MG/ML IJ SUSP
40.0000 mg | Freq: Once | INTRAMUSCULAR | Status: AC
  Administered 2022-08-15: 40 mg via INTRAMUSCULAR

## 2022-08-15 MED ORDER — KETOROLAC TROMETHAMINE 30 MG/ML IJ SOLN
30.0000 mg | Freq: Once | INTRAMUSCULAR | Status: AC
  Administered 2022-08-15: 30 mg via INTRAMUSCULAR
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
## 2022-08-16 ENCOUNTER — Other Ambulatory Visit: Payer: Self-pay | Admitting: Family

## 2022-08-16 DIAGNOSIS — Z8269 Family history of other diseases of the musculoskeletal system and connective tissue: Secondary | ICD-10-CM

## 2022-08-16 DIAGNOSIS — R768 Other specified abnormal immunological findings in serum: Secondary | ICD-10-CM

## 2022-08-16 LAB — CMP14+EGFR
ALT: 20 IU/L (ref 0–32)
AST: 28 IU/L (ref 0–40)
Albumin/Globulin Ratio: 1.9 (ref 1.2–2.2)
Albumin: 5 g/dL (ref 4.0–5.0)
Alkaline Phosphatase: 54 IU/L (ref 42–106)
BUN/Creatinine Ratio: 11 (ref 9–23)
BUN: 7 mg/dL (ref 6–20)
Bilirubin Total: 0.3 mg/dL (ref 0.0–1.2)
CO2: 21 mmol/L (ref 20–29)
Calcium: 9.7 mg/dL (ref 8.7–10.2)
Chloride: 105 mmol/L (ref 96–106)
Creatinine, Ser: 0.61 mg/dL (ref 0.57–1.00)
Globulin, Total: 2.7 g/dL (ref 1.5–4.5)
Glucose: 83 mg/dL (ref 70–99)
Potassium: 4.2 mmol/L (ref 3.5–5.2)
Sodium: 141 mmol/L (ref 134–144)
Total Protein: 7.7 g/dL (ref 6.0–8.5)
eGFR: 132 mL/min/{1.73_m2} (ref >59)

## 2022-08-16 LAB — CBC
Hematocrit: 38.5 % (ref 34.0–46.6)
Hemoglobin: 12.8 g/dL (ref 11.1–15.9)
MCH: 27.6 pg (ref 26.6–33.0)
MCHC: 33.2 g/dL (ref 31.5–35.7)
MCV: 83 fL (ref 79–97)
Platelets: 252 10*3/uL (ref 150–450)
RBC: 4.63 x10E6/uL (ref 3.77–5.28)
RDW: 12.6 % (ref 11.7–15.4)
WBC: 4 10*3/uL (ref 3.4–10.8)

## 2022-08-16 LAB — CERVICOVAGINAL ANCILLARY ONLY
Bacterial Vaginitis (gardnerella): NEGATIVE
Candida Glabrata: NEGATIVE
Candida Vaginitis: NEGATIVE
Chlamydia: NEGATIVE
Comment: NEGATIVE
Comment: NEGATIVE
Comment: NEGATIVE
Comment: NEGATIVE
Comment: NEGATIVE
Comment: NORMAL
Neisseria Gonorrhea: NEGATIVE
Trichomonas: NEGATIVE

## 2022-08-16 LAB — SEDIMENTATION RATE: Sed Rate: 20 mm/hr (ref 0–32)

## 2022-08-16 LAB — ANA: Anti Nuclear Antibody (ANA): POSITIVE — AB
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
## 2022-08-30 ENCOUNTER — Ambulatory Visit: Payer: Medicaid Other | Admitting: Family Medicine

## 2022-09-01 ENCOUNTER — Encounter: Payer: Self-pay | Admitting: Physician Assistant

## 2022-09-01 ENCOUNTER — Ambulatory Visit: Payer: Medicaid Other | Attending: Physician Assistant | Admitting: Physician Assistant

## 2022-09-01 ENCOUNTER — Telehealth: Payer: Self-pay

## 2022-09-01 VITALS — BP 126/85 | HR 71 | Resp 15 | Ht 59.0 in | Wt 159.8 lb

## 2022-09-01 DIAGNOSIS — M791 Myalgia, unspecified site: Secondary | ICD-10-CM

## 2022-09-01 DIAGNOSIS — Z8261 Family history of arthritis: Secondary | ICD-10-CM

## 2022-09-01 DIAGNOSIS — G40309 Generalized idiopathic epilepsy and epileptic syndromes, not intractable, without status epilepticus: Secondary | ICD-10-CM | POA: Diagnosis present

## 2022-09-01 DIAGNOSIS — F332 Major depressive disorder, recurrent severe without psychotic features: Secondary | ICD-10-CM | POA: Diagnosis present

## 2022-09-01 DIAGNOSIS — M2550 Pain in unspecified joint: Secondary | ICD-10-CM | POA: Insufficient documentation

## 2022-09-01 DIAGNOSIS — E559 Vitamin D deficiency, unspecified: Secondary | ICD-10-CM

## 2022-09-01 DIAGNOSIS — B009 Herpesviral infection, unspecified: Secondary | ICD-10-CM

## 2022-09-01 DIAGNOSIS — Z8269 Family history of other diseases of the musculoskeletal system and connective tissue: Secondary | ICD-10-CM | POA: Diagnosis present

## 2022-09-01 DIAGNOSIS — R5383 Other fatigue: Secondary | ICD-10-CM | POA: Insufficient documentation

## 2022-09-01 DIAGNOSIS — R768 Other specified abnormal immunological findings in serum: Secondary | ICD-10-CM | POA: Insufficient documentation

## 2022-09-01 NOTE — Telephone Encounter (Signed)
I called [facility] in [facility] and left a message to obtain knee x-rays and office note.   Phone number: [phone].

## 2022-09-05 ENCOUNTER — Other Ambulatory Visit: Payer: Self-pay | Admitting: *Deleted

## 2022-09-05 DIAGNOSIS — E559 Vitamin D deficiency, unspecified: Secondary | ICD-10-CM

## 2022-09-05 MED ORDER — VITAMIN D (ERGOCALCIFEROL) 1.25 MG (50000 UNIT) PO CAPS: ORAL_CAPSULE | ORAL | 0 refills | Status: DC

## 2022-09-05 NOTE — Progress Notes (Signed)
Vitamin D is low-12.  Please notify the patient and send in vitamin D 50,000 units twice weekly x3 months.  Recheck vitamin D in 3 months.    Rest of autoimmune lab results will be discussed at [facility].

## 2022-09-06 LAB — CARDIOLIPIN ANTIBODIES, IGG, IGM, IGA
Anticardiolipin IgA: <2 APL-U/mL (ref <20.0)
Anticardiolipin IgG: <2 GPL-U/mL (ref <20.0)
Anticardiolipin IgM: <2 MPL-U/mL (ref <20.0)

## 2022-09-06 LAB — TSH: TSH: 1.11 mIU/L

## 2022-09-06 LAB — BETA-2 GLYCOPROTEIN ANTIBODIES
Beta-2 Glyco 1 IgA: <2 U/mL (ref <20.0)
Beta-2 Glyco 1 IgM: <2 U/mL (ref <20.0)
Beta-2 Glyco I IgG: <2 U/mL (ref <20.0)

## 2022-09-06 LAB — LUPUS ANTICOAGULANT EVAL W/ REFLEX
PTT-LA Screen: 31 s (ref <=40)
dRVVT: 32 s (ref <=45)

## 2022-09-06 LAB — RNP ANTIBODY: Ribonucleic Protein(ENA) Antibody, IgG: <1 AI

## 2022-09-06 LAB — ANTI-SCLERODERMA ANTIBODY: Scleroderma (Scl-70) (ENA) Antibody, IgG: <1 AI

## 2022-09-06 LAB — ANA: Anti Nuclear Antibody (ANA): POSITIVE — AB

## 2022-09-06 LAB — SJOGRENS SYNDROME-A EXTRACTABLE NUCLEAR ANTIBODY: SSA (Ro) (ENA) Antibody, IgG: 6.1 AI — AB

## 2022-09-06 LAB — C3 AND C4
C3 Complement: 126 mg/dL (ref 83–193)
C4 Complement: 33 mg/dL (ref 15–57)

## 2022-09-06 LAB — ANTI-DNA ANTIBODY, DOUBLE-STRANDED: ds DNA Ab: <1 IU/mL

## 2022-09-06 LAB — RHEUMATOID FACTOR: Rheumatoid fact SerPl-aCnc: 15 IU/mL — ABNORMAL HIGH (ref <14)

## 2022-09-06 LAB — CK: Total CK: 94 U/L (ref 29–143)

## 2022-09-06 LAB — VITAMIN D 25 HYDROXY (VIT D DEFICIENCY, FRACTURES): Vit D, 25-Hydroxy: 12 ng/mL — ABNORMAL LOW (ref 30–100)

## 2022-09-06 LAB — ANTI-NUCLEAR AB-TITER (ANA TITER): ANA Titer 1: 1:640 {titer} — ABNORMAL HIGH

## 2022-09-06 LAB — CYCLIC CITRUL PEPTIDE ANTIBODY, IGG: Cyclic Citrullin Peptide Ab: <16 UNITS

## 2022-09-06 LAB — ANTI-SMITH ANTIBODY: ENA SM Ab Ser-aCnc: <1 AI

## 2022-09-06 LAB — C-REACTIVE PROTEIN: CRP: 0.8 mg/L (ref <8.0)

## 2022-09-06 LAB — SJOGRENS SYNDROME-B EXTRACTABLE NUCLEAR ANTIBODY: SSB (La) (ENA) Antibody, IgG: <1 AI

## 2022-09-06 LAB — ANGIOTENSIN CONVERTING ENZYME: Angiotensin-Converting Enzyme: 19 U/L (ref 9–67)
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
## 2022-09-13 ENCOUNTER — Ambulatory Visit: Payer: Medicaid Other | Admitting: Family Medicine

## 2022-09-21 ENCOUNTER — Ambulatory Visit: Payer: Medicaid Other | Admitting: Rheumatology

## 2022-09-21 DIAGNOSIS — G40309 Generalized idiopathic epilepsy and epileptic syndromes, not intractable, without status epilepticus: Secondary | ICD-10-CM

## 2022-09-21 DIAGNOSIS — F332 Major depressive disorder, recurrent severe without psychotic features: Secondary | ICD-10-CM

## 2022-09-21 DIAGNOSIS — M2550 Pain in unspecified joint: Secondary | ICD-10-CM

## 2022-09-21 DIAGNOSIS — Z8261 Family history of arthritis: Secondary | ICD-10-CM

## 2022-09-21 DIAGNOSIS — M791 Myalgia, unspecified site: Secondary | ICD-10-CM

## 2022-09-21 DIAGNOSIS — B009 Herpesviral infection, unspecified: Secondary | ICD-10-CM

## 2022-09-21 DIAGNOSIS — R768 Other specified abnormal immunological findings in serum: Secondary | ICD-10-CM

## 2022-09-21 DIAGNOSIS — E559 Vitamin D deficiency, unspecified: Secondary | ICD-10-CM

## 2022-09-21 DIAGNOSIS — Z8269 Family history of other diseases of the musculoskeletal system and connective tissue: Secondary | ICD-10-CM

## 2022-09-21 DIAGNOSIS — R5383 Other fatigue: Secondary | ICD-10-CM

## 2022-10-14 NOTE — Progress Notes (Deleted)
Office Visit Note  Patient: Samantha Rodriguez             Date of Birth: 08/17/2001           MRN: [id number]             PCP: Dorna Mai, MD Referring: Dorna Mai, MD Visit Date: 10/28/2022 Occupation: @GUAROCC @  Subjective:  Discuss lab work   History of Present Illness: Samantha Rodriguez is a 21 y.o. female with history of positive ANA and polyarthralgia.  Patient presents today to discuss lab results from her initial office visit on 09/01/22.    Plaquenil?     Activities of Daily Living:  Patient reports morning stiffness for *** {minute/hour:19697}.   Patient {ACTIONS;DENIES/REPORTS:21021675::"Denies"} nocturnal pain.  Difficulty dressing/grooming: {ACTIONS;DENIES/REPORTS:21021675::"Denies"} Difficulty climbing stairs: {ACTIONS;DENIES/REPORTS:21021675::"Denies"} Difficulty getting out of chair: {ACTIONS;DENIES/REPORTS:21021675::"Denies"} Difficulty using hands for taps, buttons, cutlery, and/or writing: {ACTIONS;DENIES/REPORTS:21021675::"Denies"}  No Rheumatology ROS completed.   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   Encounter for long-term (current) use of other medications 01/15/2013    Past Medical History:  Diagnosis Date   Depression    Generalized nonconvulsive epilepsy without mention of intractable epilepsy     Family History  Problem Relation Age of Onset   Anxiety disorder Mother    Depression Mother    Lupus Mother    Rheum arthritis Mother    Alcohol abuse Father    COPD Father    Alcohol abuse Sister    Asthma Brother    Schizophrenia Maternal Aunt    Anxiety disorder Maternal  Aunt    Depression Maternal Aunt    Drug abuse Maternal Uncle    Alcohol abuse Maternal Grandfather    Healthy Son    Past Surgical History:  Procedure Laterality Date   NO PAST SURGERIES     Social History   Social History Narrative   Not on file   Immunization History  Administered Date(s) Administered   DTaP 05/27/2003, 07/24/2003, 06/01/2004, 05/31/2005, 09/06/2007   HIB (PRP-OMP) 05/27/2003, 07/24/2003, 04/05/2004, 06/01/2004   HPV Quadrivalent 06/28/2012, 10/19/2015   Hepatitis A 08/11/2006, 09/06/2007   Hepatitis B Apr 24, 2003, 04/22/2003, 04/05/2004   IPV 05/27/2003, 07/24/2003, 06/01/2004, 09/06/2007   MMR 04/05/2004, 09/06/2007   Meningococcal Conjugate 07/18/2015   Meningococcal Mcv4o 09/07/2020   Pneumococcal Conjugate-13 05/27/2003, 07/24/2003, 04/05/2004, 06/01/2004   Tdap 07/18/2015   Varicella 04/05/2004, 09/06/2007     Objective: Vital Signs: There were no vitals taken for this visit.   Physical Exam Vitals and nursing note reviewed.  Constitutional:      Appearance: She is well-developed.  HENT:     Head: Normocephalic and atraumatic.  Eyes:     Conjunctiva/sclera: Conjunctivae normal.  Cardiovascular:     Rate and Rhythm: Normal rate and regular rhythm.     Heart sounds: Normal heart sounds.  Pulmonary:     Effort: Pulmonary effort is normal.     Breath sounds: Normal breath sounds.  Abdominal:     General: Bowel sounds are normal.     Palpations: Abdomen is soft.  Musculoskeletal:     Cervical back: Normal range of motion.  Skin:  General: Skin is warm and dry.     Capillary Refill: Capillary refill takes less than 2 seconds.  Neurological:     Mental Status: She is alert and oriented to person, place, and time.  Psychiatric:        Behavior: Behavior normal.      Musculoskeletal Exam: ***  CDAI Exam: CDAI Score: -- Patient Global: --; Provider Global: -- Swollen: --; Tender: -- Joint Exam 10/28/2022   No joint exam has been  documented for this visit   There is currently no information documented on the homunculus. Go to the Rheumatology activity and complete the homunculus joint exam.  Investigation: No additional findings.  Imaging: No results found.  Recent Labs: Lab Results  Component Value Date   WBC 4.0 08/15/2022   HGB 12.8 08/15/2022   PLT 252 08/15/2022   NA 141 08/15/2022   K 4.2 08/15/2022   CL 105 08/15/2022   CO2 21 08/15/2022   GLUCOSE 83 08/15/2022   BUN 7 08/15/2022   CREATININE 0.61 08/15/2022   BILITOT 0.3 08/15/2022   ALKPHOS 54 08/15/2022   AST 28 08/15/2022   ALT 20 08/15/2022   PROT 7.7 08/15/2022   ALBUMIN 5.0 08/15/2022   CALCIUM 9.7 08/15/2022   GFRAA NOT CALCULATED 12/07/2018    Speciality Comments: No specialty comments available.  Procedures:  No procedures performed Allergies: Patient has no known allergies.   Assessment / Plan:     Visit Diagnoses: Positive ANA (antinuclear antibody) - 09/01/22: ANA 1:640NS, Ro 6.1, La-, RNP-, RF 15, Scl-70-, Smith-, dsDNA-, complements WNL, LA-, Beta-2-, anticardiolipin-, TSH WNL, anti-CCP-, ACE WNL, CK WNL  Myalgia  Polyarthralgia  Family history of rheumatoid arthritis  Family history of systemic lupus erythematosus  Vitamin D deficiency  Other fatigue  Severe episode of recurrent major depressive disorder, without psychotic features (HCC)  HSV-1 infection  Orders: No orders of the defined types were placed in this encounter.  No orders of the defined types were placed in this encounter.   Face-to-face time spent with patient was *** minutes. Greater than 50% of time was spent in counseling and coordination of care.  Follow-Up Instructions: No follow-ups on file.   Ofilia Neas, PA-C  Note - This record has been created using Dragon software.  Chart creation errors have been sought, but may not always  have been located. Such creation errors do not reflect on  the standard of medical care.

## 2022-10-28 ENCOUNTER — Ambulatory Visit: Payer: Medicaid Other | Attending: Rheumatology | Admitting: Physician Assistant

## 2022-10-28 DIAGNOSIS — R768 Other specified abnormal immunological findings in serum: Secondary | ICD-10-CM

## 2022-10-28 DIAGNOSIS — Z8261 Family history of arthritis: Secondary | ICD-10-CM

## 2022-10-28 DIAGNOSIS — M2550 Pain in unspecified joint: Secondary | ICD-10-CM

## 2022-10-28 DIAGNOSIS — B009 Herpesviral infection, unspecified: Secondary | ICD-10-CM

## 2022-10-28 DIAGNOSIS — R5383 Other fatigue: Secondary | ICD-10-CM

## 2022-10-28 DIAGNOSIS — E559 Vitamin D deficiency, unspecified: Secondary | ICD-10-CM

## 2022-10-28 DIAGNOSIS — M791 Myalgia, unspecified site: Secondary | ICD-10-CM

## 2022-10-28 DIAGNOSIS — F332 Major depressive disorder, recurrent severe without psychotic features: Secondary | ICD-10-CM

## 2022-10-28 DIAGNOSIS — Z8269 Family history of other diseases of the musculoskeletal system and connective tissue: Secondary | ICD-10-CM

## 2023-05-01 ENCOUNTER — Encounter (HOSPITAL_COMMUNITY): Payer: Self-pay

## 2023-05-01 ENCOUNTER — Ambulatory Visit (HOSPITAL_COMMUNITY)
Admission: EM | Admit: 2023-05-01 | Discharge: 2023-05-01 | Disposition: A | Discharge status: Home / Self Care | Payer: MEDICAID | Attending: Internal Medicine | Admitting: Internal Medicine

## 2023-05-01 DIAGNOSIS — Z113 Encounter for screening for infections with a predominantly sexual mode of transmission: Secondary | ICD-10-CM | POA: Insufficient documentation

## 2023-05-01 DIAGNOSIS — N760 Acute vaginitis: Secondary | ICD-10-CM | POA: Diagnosis not present

## 2023-05-01 DIAGNOSIS — A5602 Chlamydial vulvovaginitis: Secondary | ICD-10-CM | POA: Diagnosis not present

## 2023-05-01 DIAGNOSIS — N3000 Acute cystitis without hematuria: Secondary | ICD-10-CM | POA: Diagnosis not present

## 2023-05-01 LAB — POCT URINALYSIS DIP (MANUAL ENTRY)
Bilirubin, UA: NEGATIVE
Glucose, UA: NEGATIVE mg/dL
Ketones, POC UA: NEGATIVE mg/dL
Nitrite, UA: NEGATIVE
Protein Ur, POC: NEGATIVE mg/dL
Spec Grav, UA: 1.02 (ref 1.010–1.025)
Urobilinogen, UA: 0.2 E.U./dL
pH, UA: 7 (ref 5.0–8.0)

## 2023-05-01 LAB — POCT URINE PREGNANCY: Preg Test, Ur: NEGATIVE

## 2023-05-01 MED ORDER — NITROFURANTOIN MONOHYD MACRO 100 MG PO CAPS: 100.0000 mg | ORAL_CAPSULE | Freq: Two times a day (BID) | ORAL | 0 refills | Status: DC

## 2023-05-01 NOTE — Discharge Instructions (Addendum)
We will call you if we have to make any changes to your antibiotic or if the STD test has a positive result
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
## 2023-05-01 NOTE — ED Triage Notes (Signed)
Pt c/o burning on urination with lower abdominal pain x2 days. States concerns for STD and pregnancy.

## 2023-05-02 LAB — CERVICOVAGINAL ANCILLARY ONLY
Bacterial Vaginitis (gardnerella): POSITIVE — AB
Candida Glabrata: NEGATIVE
Candida Vaginitis: NEGATIVE
Chlamydia: POSITIVE — AB
Comment: NEGATIVE
Comment: NEGATIVE
Comment: NEGATIVE
Comment: NEGATIVE
Comment: NEGATIVE
Comment: NORMAL
Neisseria Gonorrhea: NEGATIVE
Trichomonas: NEGATIVE

## 2023-05-02 LAB — URINE CULTURE
Culture: >=100000 — AB
Special Requests: NORMAL

## 2023-05-03 ENCOUNTER — Telehealth (HOSPITAL_COMMUNITY): Payer: Self-pay | Admitting: Emergency Medicine

## 2023-05-03 MED ORDER — METRONIDAZOLE 500 MG PO TABS: 500.0000 mg | ORAL_TABLET | Freq: Two times a day (BID) | ORAL | 0 refills | Status: DC

## 2023-05-03 MED ORDER — DOXYCYCLINE HYCLATE 100 MG PO CAPS: 100.0000 mg | ORAL_CAPSULE | Freq: Two times a day (BID) | ORAL | 0 refills | Status: DC

## 2023-05-05 ENCOUNTER — Telehealth (HOSPITAL_COMMUNITY): Payer: Self-pay

## 2023-05-05 MED ORDER — DOXYCYCLINE HYCLATE 100 MG PO CAPS: 100.0000 mg | ORAL_CAPSULE | Freq: Two times a day (BID) | ORAL | 0 refills | Status: AC

## 2023-05-05 MED ORDER — METRONIDAZOLE 500 MG PO TABS: 500.0000 mg | ORAL_TABLET | Freq: Two times a day (BID) | ORAL | 0 refills | Status: DC

## 2023-05-05 NOTE — Telephone Encounter (Signed)
Attempted to call regarding a Rx issue. No answer.

## 2023-07-19 ENCOUNTER — Ambulatory Visit (HOSPITAL_COMMUNITY): Payer: MEDICAID | Admitting: Licensed Clinical Social Worker

## 2023-08-09 ENCOUNTER — Ambulatory Visit
Admission: EM | Admit: 2023-08-09 | Discharge: 2023-08-09 | Disposition: A | Discharge status: Home / Self Care | Payer: MEDICAID | Attending: Internal Medicine | Admitting: Internal Medicine

## 2023-08-09 DIAGNOSIS — M545 Low back pain, unspecified: Secondary | ICD-10-CM | POA: Diagnosis not present

## 2023-08-09 MED ORDER — IBUPROFEN 600 MG PO TABS: 600.0000 mg | ORAL_TABLET | Freq: Four times a day (QID) | ORAL | 0 refills | Status: AC | PRN

## 2023-08-09 MED ORDER — METHOCARBAMOL 500 MG PO TABS: 500.0000 mg | ORAL_TABLET | Freq: Two times a day (BID) | ORAL | 0 refills | Status: AC | PRN

## 2023-08-09 NOTE — ED Triage Notes (Signed)
Patient here today with c/o LB pain X 2 days. Patient states that she has a h/o LB pain and the cold temperatures aggravate it. However, ice seems to help. She has used IBU which seems to help but it makes her sleepy.
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
## 2023-08-09 NOTE — Discharge Instructions (Signed)
I have prescribed you ibuprofen and a muscle relaxer to take for back pain.  Please be advised that muscle relaxer can make you drowsy so do not drive or drink alcohol with taking it.  Follow-up with your established back specialist if symptoms persist or worsen.

## 2023-09-27 ENCOUNTER — Ambulatory Visit (HOSPITAL_COMMUNITY): Payer: MEDICAID | Admitting: Licensed Clinical Social Worker

## 2023-10-12 ENCOUNTER — Telehealth (HOSPITAL_COMMUNITY): Payer: Self-pay

## 2023-10-12 ENCOUNTER — Ambulatory Visit (HOSPITAL_COMMUNITY): Payer: MEDICAID | Admitting: Licensed Clinical Social Worker

## 2023-10-12 ENCOUNTER — Encounter (HOSPITAL_COMMUNITY): Payer: Self-pay | Admitting: Licensed Clinical Social Worker

## 2023-10-12 DIAGNOSIS — F1094 Alcohol use, unspecified with alcohol-induced mood disorder: Secondary | ICD-10-CM | POA: Diagnosis not present

## 2023-10-12 NOTE — Telephone Encounter (Signed)
Samantha Rodriguez called this office and said she wanted some tips to deal with her alcohol use.  This therapist spoke with her and confirmed her identity by obtaining two verifiers.  This therapist explained the services we offer and she said she did not want any professional help. Therapist suggested AA and obtaining a sponsor. She agreed. Therapist explains if she needs anything else to please call back.  Remigio Eisenmenger, MS, LMFT, LCSD 10/12/23
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
## 2024-10-28 ENCOUNTER — Ambulatory Visit
Admission: EM | Admit: 2024-10-28 | Discharge: 2024-10-28 | Disposition: A | Discharge status: Home / Self Care | Payer: MEDICAID | Attending: Physician Assistant | Admitting: Physician Assistant

## 2024-10-28 DIAGNOSIS — H1032 Unspecified acute conjunctivitis, left eye: Secondary | ICD-10-CM | POA: Diagnosis not present

## 2024-10-28 MED ORDER — TOBRAMYCIN 0.3 % OP SOLN: 1.0000 [drp] | OPHTHALMIC | 0 refills | Status: AC

## 2024-10-28 NOTE — ED Provider Notes (Signed)
 " [facility]    CSN: [id number] Arrival date & time: 10/28/24  1358      History   Chief Complaint No chief complaint on file.   HPI Samantha Rodriguez is a 23 y.o. female.   Patient complains of irritation to her left eye and drainage.  Patient is here with her child who has the same.  Patient denies any fever or chills she has not had any cough or congestion.  The history is provided by the patient. No language interpreter was used.    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 Authorizing Provider  tobramycin  (TOBREX ) 0.3 % ophthalmic solution Place 1 drop into the right eye every 4 (four) hours for 10 days. 10/28/24 11/07/24 Yes Keahi Mccarney K, PA-C  ibuprofen  (ADVIL ) 600 MG tablet Take 1 tablet (600 mg total) by mouth every 6 (six) hours as needed for mild pain or moderate pain. 08/09/23   Hazen Darryle BRAVO, FNP  methocarbamol  (ROBAXIN ) 500 MG tablet Take 1 tablet (500 mg  total) by mouth 2 (two) times daily as needed for muscle spasms. 08/09/23   Hazen Darryle BRAVO, FNP    Family History Family History  Problem Relation Age of Onset   Anxiety disorder Mother    Depression Mother    Lupus Mother    Rheum arthritis Mother    Alcohol abuse Father    COPD Father    Alcohol abuse Sister    Asthma Brother    Schizophrenia Maternal Aunt    Anxiety disorder Maternal Aunt    Depression Maternal Aunt    Drug abuse Maternal Uncle    Alcohol abuse Maternal Grandfather    Healthy Son     Social History Social History[1]   Allergies   Patient has no known allergies.   Review of Systems Review of Systems  All other systems reviewed and are negative.    Physical Exam Triage Vital Signs ED Triage Vitals  Encounter Vitals Group     BP 10/28/24 1524 125/83     Girls Systolic BP Percentile --      Girls Diastolic BP Percentile --      Boys Systolic BP Percentile --  Boys Diastolic BP Percentile --      Pulse Rate 10/28/24 1524 80     Resp 10/28/24 1524 16     Temp 10/28/24 1524 98.1 F (36.7 C)     Temp Source 10/28/24 1524 Oral     SpO2 10/28/24 1524 98 %     Weight --      Height --      Head Circumference --      Peak Flow --      Pain Score 10/28/24 1523 0     Pain Loc --      Pain Education --      Exclude from Growth Chart --    No data found.  Updated Vital Signs BP 125/83 (BP Location: Right Arm)   Pulse 80   Temp 98.1 F (36.7 C) (Oral)   Resp 16   SpO2 98%   Visual Acuity Right Eye Distance:   Left Eye Distance:   Bilateral Distance:    Right Eye Near:   Left Eye Near:    Bilateral Near:     Physical Exam Vitals and nursing note reviewed.  Constitutional:      Appearance: She is well-developed.  HENT:     Head: Normocephalic.  Eyes:     Extraocular Movements: Extraocular movements intact.     Pupils: Pupils are equal, round, and reactive to light.     Comments: Slight injection left conjunctiva   Cardiovascular:     Rate and Rhythm: Normal rate.  Pulmonary:     Effort: Pulmonary effort is normal.  Abdominal:     General: There is no distension.  Musculoskeletal:        General: Normal range of motion.  Skin:    General: Skin is warm.  Neurological:     General: No focal deficit present.     Mental Status: She is alert and oriented to person, place, and time.      UC Treatments / Results  Labs (all labs ordered are listed, but only abnormal results are displayed) Labs Reviewed - No data to display  EKG   Radiology No results found.  Procedures Procedures (including critical care time)  Medications Ordered in UC Medications - No data to display  Initial Impression / Assessment and Plan / UC Course  I have reviewed the triage vital signs and the nursing notes.  Pertinent labs & imaging results that were available during my care of the patient were reviewed by me and considered in my medical decision making (see chart for details).     Patient is here with her child who has conjunctivitis.  She has some eye irritation.  Patient is given a prescription for Tobrex . Final Clinical Impressions(s) / UC Diagnoses   Final diagnoses:  Acute conjunctivitis of left eye, unspecified acute conjunctivitis type     Discharge Instructions      Return if any problems.    ED Prescriptions     Medication Sig Dispense Auth. Provider   tobramycin  (TOBREX ) 0.3 % ophthalmic solution Place 1 drop into the right eye every 4 (four) hours for 10 days. 5 mL [name], PA-C      PDMP not reviewed this encounter. An After Visit Summary was printed and given to the patient.        [1]  Social History Tobacco Use   Smoking status: Every Day    Types: Cigars    Passive exposure: Never   Smokeless tobacco: Never  Vaping  Use   Vaping status: Never Used  Substance Use Topics   Alcohol use: Yes    Alcohol/week: 14.0 standard drinks of alcohol    Types: 14  Cans of beer per week    Comment: every other day   Drug use: Yes    Types: Marijuana    Comment: every day: 3.5 grams weekly     [name], PA-C 10/28/24 1626  "

## 2024-10-28 NOTE — ED Triage Notes (Signed)
 Pt presents to UC for c/o pink eye in left eye since yesterday. She reports mucous drainage from left eye.

## 2024-10-28 NOTE — Discharge Instructions (Addendum)
 Return if any problems.
